# Patient Record
Sex: Female | Born: 2000 | Race: Black or African American | Hispanic: No | Marital: Single | Smoking: Never smoker
Health system: Southern US, Community
[De-identification: ages and names within clinical notes are randomized; demographics above are authoritative.]

## PROBLEM LIST (undated history)

## (undated) ENCOUNTER — Emergency Department (HOSPITAL_COMMUNITY): Admission: EM | Discharge status: Absent without leave | Payer: Medicaid Other

## (undated) DIAGNOSIS — F32A Depression, unspecified: Secondary | ICD-10-CM

## (undated) HISTORY — DX: Depression, unspecified: F32.A

---
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
## 2020-04-16 ENCOUNTER — Ambulatory Visit (HOSPITAL_COMMUNITY)
Admission: AD | Admit: 2020-04-16 | Discharge: 2020-04-16 | Disposition: A | Discharge status: Home / Self Care | Payer: Medicaid Other | Attending: Psychiatry | Admitting: Psychiatry

## 2020-04-16 ENCOUNTER — Encounter (HOSPITAL_COMMUNITY): Payer: Self-pay | Admitting: Emergency Medicine

## 2020-04-16 ENCOUNTER — Emergency Department (HOSPITAL_COMMUNITY)
Admission: EM | Admit: 2020-04-16 | Discharge: 2020-04-16 | Disposition: A | Discharge status: Home / Self Care | Payer: Medicaid Other | Attending: Emergency Medicine | Admitting: Emergency Medicine

## 2020-04-16 ENCOUNTER — Other Ambulatory Visit: Payer: Self-pay

## 2020-04-16 ENCOUNTER — Emergency Department (HOSPITAL_COMMUNITY): Payer: Medicaid Other

## 2020-04-16 DIAGNOSIS — Z79899 Other long term (current) drug therapy: Secondary | ICD-10-CM | POA: Diagnosis not present

## 2020-04-16 DIAGNOSIS — R0789 Other chest pain: Secondary | ICD-10-CM | POA: Diagnosis present

## 2020-04-16 DIAGNOSIS — R45851 Suicidal ideations: Secondary | ICD-10-CM | POA: Insufficient documentation

## 2020-04-16 DIAGNOSIS — R450 Nervousness: Secondary | ICD-10-CM | POA: Insufficient documentation

## 2020-04-16 DIAGNOSIS — F410 Panic disorder [episodic paroxysmal anxiety] without agoraphobia: Secondary | ICD-10-CM | POA: Insufficient documentation

## 2020-04-16 DIAGNOSIS — F419 Anxiety disorder, unspecified: Secondary | ICD-10-CM | POA: Diagnosis not present

## 2020-04-16 DIAGNOSIS — F332 Major depressive disorder, recurrent severe without psychotic features: Secondary | ICD-10-CM | POA: Diagnosis not present

## 2020-04-16 DIAGNOSIS — R634 Abnormal weight loss: Secondary | ICD-10-CM | POA: Insufficient documentation

## 2020-04-16 LAB — CBC
HCT: 41.4 % (ref 36.0–46.0)
Hemoglobin: 14.4 g/dL (ref 12.0–15.0)
MCH: 30.4 pg (ref 26.0–34.0)
MCHC: 34.8 g/dL (ref 30.0–36.0)
MCV: 87.3 fL (ref 80.0–100.0)
Platelets: 231 K/uL (ref 150–400)
RBC: 4.74 MIL/uL (ref 3.87–5.11)
RDW: 11.9 % (ref 11.5–15.5)
WBC: 5.5 K/uL (ref 4.0–10.5)
nRBC: 0 % (ref 0.0–0.2)

## 2020-04-16 LAB — COMPREHENSIVE METABOLIC PANEL
ALT: 20 U/L (ref 0–44)
AST: 19 U/L (ref 15–41)
Albumin: 5.2 g/dL — ABNORMAL HIGH (ref 3.5–5.0)
Alkaline Phosphatase: 54 U/L (ref 38–126)
Anion gap: 10 (ref 5–15)
BUN: 15 mg/dL (ref 6–20)
CO2: 23 mmol/L (ref 22–32)
Calcium: 9.4 mg/dL (ref 8.9–10.3)
Chloride: 103 mmol/L (ref 98–111)
Creatinine, Ser: 0.55 mg/dL (ref 0.44–1.00)
GFR calc Af Amer: >60 mL/min (ref >60)
GFR calc non Af Amer: >60 mL/min (ref >60)
Glucose, Bld: 86 mg/dL (ref 70–99)
Potassium: 3.7 mmol/L (ref 3.5–5.1)
Sodium: 136 mmol/L (ref 135–145)
Total Bilirubin: 1.5 mg/dL — ABNORMAL HIGH (ref 0.3–1.2)
Total Protein: 8.3 g/dL — ABNORMAL HIGH (ref 6.5–8.1)

## 2020-04-16 LAB — PREGNANCY, URINE: Preg Test, Ur: NEGATIVE

## 2020-04-16 LAB — TROPONIN I (HIGH SENSITIVITY)
Troponin I (High Sensitivity): <2 ng/L (ref <18)
Troponin I (High Sensitivity): <2 ng/L (ref <18)

## 2020-04-16 MED ORDER — ALUM & MAG HYDROXIDE-SIMETH 200-200-20 MG/5ML PO SUSP
30.0000 mL | Freq: Once | ORAL | Status: AC
  Administered 2020-04-16: 30 mL via ORAL
  Filled 2020-04-16: qty 30

## 2020-04-16 MED ORDER — OMEPRAZOLE 20 MG PO CPDR: 20.0000 mg | DELAYED_RELEASE_CAPSULE | Freq: Two times a day (BID) | ORAL | 0 refills | Status: AC

## 2020-04-16 MED ORDER — LIDOCAINE VISCOUS HCL 2 % MT SOLN
15.0000 mL | Freq: Once | OROMUCOSAL | Status: AC
  Administered 2020-04-16: 15 mL via ORAL
  Filled 2020-04-16: qty 15
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
## 2020-04-16 NOTE — H&P (Addendum)
Behavioral Health Medical Screening Exam  Catherine Valenzuela is an 19 y.o. female who reports to [facility] walk in accompanied by her mother for evaluation of worsening depressive symptoms.  The patient came at the request of her primary care provider, Kayren Eaves, NP.  Ms. Anselm Jungling also phoned me regarding the patient.  She saw the patient earlier today, per her assessment the patient was disheveled, depressed mood, endorsing panic attacks, suicidal ideations and approximately 25 pound weight loss over the past few months.  Since this was a marked change, the patient would benefit from further psychiatric evaluation.    On evaluation today, she appears sad, endorses depressed mood in the setting of several psychosocial familial stressors that includes the lost of 3 family members within the past ~7 months.  One of the deaths is suspected suicide. The patient also endorses neurovegetative symptoms.  She also reports sleeping less, eating less and worrying more.  She reports a long standing history for depression with failed attempts to medicate with SSRIs.  She has a remarkable familial history for depression, and bipolar disorder which includes a first degree relative.  She denies previous inpatient psychiatric hospitalization, reports 4 previous suicidal attempts that includes an overdose attempt a few days prior.  She states she took bills and drank wine and went to sleep.  When asked of her goal she states, " I just did not want to wake up".      Total Time spent with patient: 30 minutes  Psychiatric Specialty Exam: Physical Exam  Constitutional: She is oriented to person, place, and time.  Thin framed female  Eyes: Pupils are equal, round, and reactive to light.  Cardiovascular: Normal rate.  Respiratory: Effort normal.  Musculoskeletal:        General: Normal range of motion.     Cervical back: Normal range of motion.  Neurological: She is alert and oriented to person, place, and time.    Review of  Systems  Psychiatric/Behavioral: Positive for decreased concentration, self-injury, sleep disturbance and suicidal ideas (with recent intent to overdose on "sleep pills and wine"). Negative for agitation, behavioral problems and hallucinations. The patient is nervous/anxious.     There were no vitals taken for this visit.There is no height or weight on file to calculate BMI.  General Appearance: Fairly Groomed and wearing hair bonnet and black hoodie over her head  Eye Contact:  Good  Speech:  Normal Rate and partially slurred d/t baseline hearing deficits  Volume:  Normal  Mood:  Anxious and Depressed  Affect:  Congruent and Labile  Thought Process:  Coherent and Descriptions of Associations: Circumstantial  Orientation:  Full (Time, Place, and Person)  Thought Content:  Illogical and in the setting of recent attempt to overdose  Suicidal Thoughts:  Yes.  with intent/plan  Homicidal Thoughts:  No  Memory:  Immediate;   Good Recent;   Good Remote;   Good  Judgement:  Impaired  Insight:  Fair  Psychomotor Activity:  Increased  Concentration: Concentration: Fair and Attention Span: Fair  Recall:  Good  Fund of Knowledge:Good  Language: Good  Akathisia:  NA  Handed:  Right  AIMS (if indicated):     Assets:  Communication Skills Desire for Improvement Housing Social Support  Sleep:   < 6 hours    Musculoskeletal: Strength & Muscle Tone: within normal limits Gait & Station: normal Patient leans: N/A  There were no vitals taken for this visit.  Recommendations:  Based on my evaluation the patient  appears to have an emergency medical condition for which I recommend the patient be transferred to the emergency department for further evaluation.   The patient endorses left sided chest discomfort x 2 weeks but worsening over the past 48 hours. She reports a hx for panic attacks which likely explains her symptoms but I do not have baseline knowledge of this patient.  Thus, in the  interest of safety, recommend ED evaluation to r/o other concerns.     Once stabilized, recommend inpatient psychiatric care to start medications for mood stabilization.  The plan was discussed with the patient who voluntarily accepts admission.   Pending labs, she can be started on the following: Hydroxyzine 25mg  po TID anxiety Remeron 15 mg po qhs for mood stabilization  Report was called Erline Levine, Camera operator at [employer].   Mallie Darting, NP 04/16/2020, 6:45 PM

## 2020-04-16 NOTE — ED Provider Notes (Signed)
[place] DEPT Provider Note   CSN: [id number] Arrival date & time: 04/16/20  1903     History No chief complaint on file.   Catherine Valenzuela is a 19 y.o. female.  The history is provided by the patient and a relative. No language interpreter was used.     19 year old female with history of congenital deafness, hard of hearing, brought in accompanied by family members for evaluation of chest pain.  History obtained through patient and through sister who is at bedside.  For the past 3 days patient has had intermittent midsternal and left-sided chest pain.  Pain is sharp, waxing waning, brought on by eating, and currently rates pain as 5 out of 10.  She noticed more pain at nighttime.  No associated fever chills no productive cough no shortness of breath nausea vomiting diarrhea or rash.  She used marijuana on occasion.  She does have family history of cardiac disease.  She denies any specific treatment tried at home.  No recent Covid exposure.  History reviewed. No pertinent past medical history.  There are no problems to display for this patient.   History reviewed. No pertinent surgical history.   OB History   No obstetric history on file.     Family History  Problem Relation Age of Onset  . Hypertension Mother     Social History   Tobacco Use  . Smoking status: Never Smoker  . Smokeless tobacco: Never Used  Substance Use Topics  . Alcohol use: No  . Drug use: No    Home Medications Prior to Admission medications   Medication Sig Start Date End Date Taking? Authorizing Provider  cholecalciferol (VITAMIN D) 1000 units tablet Take 1,000 Units by mouth every 3 (three) days.    [provider]    Allergies    Patient has no known allergies.  Review of Systems   Review of Systems  All other systems reviewed and are negative.   Physical Exam Updated Vital Signs BP 112/87 (BP Location: Left Arm)   Pulse (!) 102   Temp  98.1 F (36.7 C) (Oral)   Resp 16   SpO2 100%   Physical Exam Vitals and nursing note reviewed.  Constitutional:      General: She is not in acute distress.    Appearance: She is well-developed.  HENT:     Head: Atraumatic.  Eyes:     Conjunctiva/sclera: Conjunctivae normal.  Cardiovascular:     Rate and Rhythm: Normal rate and regular rhythm.     Pulses: Normal pulses.     Heart sounds: Normal heart sounds.  Pulmonary:     Effort: Pulmonary effort is normal.     Breath sounds: Normal breath sounds.  Chest:     Chest wall: No tenderness.  Abdominal:     Palpations: Abdomen is soft.     Tenderness: There is no abdominal tenderness.  Musculoskeletal:     Cervical back: Neck supple.     Right lower leg: No edema.     Left lower leg: No edema.  Skin:    Findings: No rash.  Neurological:     Mental Status: She is alert.  Psychiatric:        Mood and Affect: Mood normal.     ED Results / Procedures / Treatments   Labs (all labs ordered are listed, but only abnormal results are displayed) Labs Reviewed  COMPREHENSIVE METABOLIC PANEL - Abnormal; Notable for the following components:  Result Value   Total Protein 8.3 (*)    Albumin 5.2 (*)    Total Bilirubin 1.5 (*)    All other components within normal limits  PREGNANCY, URINE  CBC  TROPONIN I (HIGH SENSITIVITY)  TROPONIN I (HIGH SENSITIVITY)    EKG EKG Interpretation  Date/Time:  Thursday Apr 16 2020 21:41:18 EDT Ventricular Rate:  78 PR Interval:    QRS Duration: 75 QT Interval:  364 QTC Calculation: 415 R Axis:   80 Text Interpretation: SR no acute ischemic appeaance. Confirmed by Arby Barrette [phone]) on 04/16/2020 10:51:10 PM    Date: 04/16/2020  Rate: 78  Rhythm: normal sinus rhythm  QRS Axis: normal  Intervals: normal  ST/T Wave abnormalities: normal  Conduction Disutrbances: none  Narrative Interpretation: monitor indicating afib, however pt has normal sinus rhythm.  Old EKG Reviewed: No  significant changes noted     Radiology DG Chest 1 View  Result Date: 04/16/2020 CLINICAL DATA:  Shortness of breath, chest pain EXAM: CHEST  1 VIEW COMPARISON:  None. FINDINGS: Single frontal view of the chest demonstrates an unremarkable cardiac silhouette. No airspace disease, effusion, or pneumothorax. Mild right convex curvature in the upper lumbar spine. No acute bony abnormalities. IMPRESSION: 1. No acute process. Electronically Signed   By: Sharlet Salina M.D.   On: 04/16/2020 19:55    Procedures Procedures (including critical care time)  Medications Ordered in ED Medications - No data to display  ED Course  I have reviewed the triage vital signs and the nursing notes.  Pertinent labs & imaging results that were available during my care of the patient were reviewed by me and considered in my medical decision making (see chart for details).    MDM Rules/Calculators/A&P                      BP 114/69   Pulse 89   Temp 98.1 F (36.7 C) (Oral)   Resp 16   SpO2 100%    Final Clinical Impression(s) / ED Diagnoses Final diagnoses:  Atypical chest pain    Rx / DC Orders ED Discharge Orders         Ordered    omeprazole (PRILOSEC) 20 MG capsule  2 times daily before meals     04/16/20 2252         8:56 PM Patient here with planes of chest pain.  Pain is atypical of ACS.  Pain is more likely to be gastritis/GERD.  Heart score of 1, low risk of Mace.  We will give GI cocktail, work-up initiated.  No shortness of breath therefore low suspicion for PE.  10:46 PM Negative delta troponin.  Labs are reassuring.  EKG unremarkable.  Patient received GI cocktail with resolve her symptoms.  Will discharge home with PPI for suspected gastritis/GERD.  Return precautions discussed.   Fayrene Helper, PA-C 04/16/20 2252    Arby Barrette, MD 04/16/20 2302

## 2020-06-26 ENCOUNTER — Emergency Department (HOSPITAL_BASED_OUTPATIENT_CLINIC_OR_DEPARTMENT_OTHER)
Admission: EM | Admit: 2020-06-26 | Discharge: 2020-06-26 | Disposition: A | Discharge status: Home / Self Care | Payer: Medicaid Other | Attending: Emergency Medicine | Admitting: Emergency Medicine

## 2020-06-26 ENCOUNTER — Other Ambulatory Visit: Payer: Self-pay

## 2020-06-26 ENCOUNTER — Encounter (HOSPITAL_BASED_OUTPATIENT_CLINIC_OR_DEPARTMENT_OTHER): Payer: Self-pay

## 2020-06-26 DIAGNOSIS — R350 Frequency of micturition: Secondary | ICD-10-CM | POA: Diagnosis not present

## 2020-06-26 DIAGNOSIS — R1032 Left lower quadrant pain: Secondary | ICD-10-CM | POA: Diagnosis present

## 2020-06-26 LAB — URINALYSIS, ROUTINE W REFLEX MICROSCOPIC
Bilirubin Urine: NEGATIVE
Glucose, UA: NEGATIVE mg/dL
Ketones, ur: NEGATIVE mg/dL
Leukocytes,Ua: NEGATIVE
Nitrite: NEGATIVE
Protein, ur: NEGATIVE mg/dL
Specific Gravity, Urine: 1.02 (ref 1.005–1.030)
pH: 6.5 (ref 5.0–8.0)

## 2020-06-26 LAB — COMPREHENSIVE METABOLIC PANEL
ALT: 22 U/L (ref 0–44)
AST: 23 U/L (ref 15–41)
Albumin: 5.2 g/dL — ABNORMAL HIGH (ref 3.5–5.0)
Alkaline Phosphatase: 52 U/L (ref 38–126)
Anion gap: 11 (ref 5–15)
BUN: 9 mg/dL (ref 6–20)
CO2: 25 mmol/L (ref 22–32)
Calcium: 9.7 mg/dL (ref 8.9–10.3)
Chloride: 101 mmol/L (ref 98–111)
Creatinine, Ser: 0.58 mg/dL (ref 0.44–1.00)
GFR calc Af Amer: >60 mL/min (ref >60)
GFR calc non Af Amer: >60 mL/min (ref >60)
Glucose, Bld: 86 mg/dL (ref 70–99)
Potassium: 4.1 mmol/L (ref 3.5–5.1)
Sodium: 137 mmol/L (ref 135–145)
Total Bilirubin: 0.5 mg/dL (ref 0.3–1.2)
Total Protein: 8.7 g/dL — ABNORMAL HIGH (ref 6.5–8.1)

## 2020-06-26 LAB — URINALYSIS, MICROSCOPIC (REFLEX)

## 2020-06-26 LAB — CBC WITH DIFFERENTIAL/PLATELET
Abs Immature Granulocytes: 0.01 10*3/uL (ref 0.00–0.07)
Basophils Absolute: 0 10*3/uL (ref 0.0–0.1)
Basophils Relative: 1 %
Eosinophils Absolute: 0 10*3/uL (ref 0.0–0.5)
Eosinophils Relative: 1 %
HCT: 39.2 % (ref 36.0–46.0)
Hemoglobin: 13.5 g/dL (ref 12.0–15.0)
Immature Granulocytes: 0 %
Lymphocytes Relative: 36 %
Lymphs Abs: 1.8 10*3/uL (ref 0.7–4.0)
MCH: 30.1 pg (ref 26.0–34.0)
MCHC: 34.4 g/dL (ref 30.0–36.0)
MCV: 87.3 fL (ref 80.0–100.0)
Monocytes Absolute: 0.3 10*3/uL (ref 0.1–1.0)
Monocytes Relative: 7 %
Neutro Abs: 2.8 10*3/uL (ref 1.7–7.7)
Neutrophils Relative %: 55 %
Platelets: 181 10*3/uL (ref 150–400)
RBC: 4.49 MIL/uL (ref 3.87–5.11)
RDW: 11.4 % — ABNORMAL LOW (ref 11.5–15.5)
WBC: 4.9 10*3/uL (ref 4.0–10.5)
nRBC: 0 % (ref 0.0–0.2)

## 2020-06-26 LAB — HCG, QUANTITATIVE, PREGNANCY: hCG, Beta Chain, Quant, S: <1 m[IU]/mL (ref <5)

## 2020-06-26 LAB — WET PREP, GENITAL
Clue Cells Wet Prep HPF POC: NONE SEEN
Sperm: NONE SEEN
Trich, Wet Prep: NONE SEEN
Yeast Wet Prep HPF POC: NONE SEEN

## 2020-06-26 LAB — LIPASE, BLOOD: Lipase: 24 U/L (ref 11–51)

## 2020-06-26 LAB — PREGNANCY, URINE: Preg Test, Ur: NEGATIVE

## 2020-06-26 NOTE — ED Triage Notes (Signed)
Abd pain x 1 week, at home preg test positive ~2 days ago, decreased appetite  n no v/d, NAD

## 2020-06-26 NOTE — ED Provider Notes (Signed)
[facility] EMERGENCY DEPARTMENT Provider Note   CSN: [id number] Arrival date & time: 06/26/20  1825     History Chief Complaint  Patient presents with  . Abdominal Pain    Catherine Valenzuela is a 19 y.o. female.  19 year old female with complaint of left lower quadrant abdominal pain x1 week.  Pain is intermittent, described as an ache, nothing makes pain better or worse.  Denies any associated nausea, vomiting, changes in bowel or bladder habits or vaginal discharge.  Patient is sexually active, not on birth control, not using condoms.  Patient states that she stopped birth control 1 year ago and has not had a period since then.  Patient had a pregnancy test at home which was very faintly positive and came to the ER today at the prompting of her mother.  No other complaints or concerns today.        History reviewed. No pertinent past medical history.  There are no problems to display for this patient.   History reviewed. No pertinent surgical history.   OB History   No obstetric history on file.     Family History  Problem Relation Age of Onset  . Hypertension Mother     Social History   Tobacco Use  . Smoking status: Never Smoker  . Smokeless tobacco: Never Used  Substance Use Topics  . Alcohol use: No  . Drug use: No    Home Medications Prior to Admission medications   Medication Sig Start Date End Date Taking? Authorizing Provider  cholecalciferol (VITAMIN D) 1000 units tablet Take 1,000 Units by mouth every 3 (three) days.    [provider]  omeprazole (PRILOSEC) 20 MG capsule Take 1 capsule (20 mg total) by mouth 2 (two) times daily before a meal. 04/16/20   Fayrene Helper, PA-C    Allergies    Patient has no known allergies.  Review of Systems   Review of Systems  Constitutional: Negative for chills and fever.  Respiratory: Negative for shortness of breath.   Cardiovascular: Negative for chest pain.  Gastrointestinal: Positive for  abdominal pain. Negative for nausea and vomiting.  Genitourinary: Positive for frequency. Negative for dysuria, vaginal bleeding and vaginal discharge.  Musculoskeletal: Negative for back pain.  Skin: Negative for rash and wound.  Neurological: Negative for weakness.  Hematological: Negative for adenopathy.  All other systems reviewed and are negative.   Physical Exam Updated Vital Signs BP (!) 110/57   Pulse 74   Temp 99 F (37.2 C) (Oral)   Resp 17   Ht 5\' 6"  (1.676 m)   Wt 54.4 kg   SpO2 100%   BMI 19.37 kg/m   Physical Exam Vitals and nursing note reviewed. Exam conducted with a chaperone present.  Constitutional:      General: She is not in acute distress.    Appearance: She is well-developed. She is not diaphoretic.  HENT:     Head: Normocephalic and atraumatic.  Cardiovascular:     Rate and Rhythm: Normal rate and regular rhythm.     Heart sounds: Normal heart sounds.  Pulmonary:     Effort: Pulmonary effort is normal.     Breath sounds: Normal breath sounds.  Abdominal:     Palpations: Abdomen is soft.     Tenderness: There is abdominal tenderness in the left lower quadrant. There is no right CVA tenderness or left CVA tenderness.  Genitourinary:    Vagina: No tenderness.     Cervix: No cervical motion  tenderness, friability, lesion, erythema or cervical bleeding.     Uterus: Normal. Not tender.      Adnexa: Right adnexa normal and left adnexa normal.       Right: No mass, tenderness or fullness.         Left: No mass, tenderness or fullness.       Comments: Trace amount of white discharge Skin:    General: Skin is warm and dry.     Findings: No erythema or rash.  Neurological:     Mental Status: She is alert and oriented to person, place, and time.  Psychiatric:        Behavior: Behavior normal.     ED Results / Procedures / Treatments   Labs (all labs ordered are listed, but only abnormal results are displayed) Labs Reviewed  WET PREP, GENITAL -  Abnormal; Notable for the following components:      Result Value   WBC, Wet Prep HPF POC MANY (*)    All other components within normal limits  COMPREHENSIVE METABOLIC PANEL - Abnormal; Notable for the following components:   Total Protein 8.7 (*)    Albumin 5.2 (*)    All other components within normal limits  URINALYSIS, ROUTINE W REFLEX MICROSCOPIC - Abnormal; Notable for the following components:   Hgb urine dipstick TRACE (*)    All other components within normal limits  URINALYSIS, MICROSCOPIC (REFLEX) - Abnormal; Notable for the following components:   Bacteria, UA RARE (*)    All other components within normal limits  CBC WITH DIFFERENTIAL/PLATELET - Abnormal; Notable for the following components:   RDW 11.4 (*)    All other components within normal limits  LIPASE, BLOOD  PREGNANCY, URINE  HCG, QUANTITATIVE, PREGNANCY  CBC WITH DIFFERENTIAL/PLATELET  RPR  HIV ANTIBODY (ROUTINE TESTING W REFLEX)  GC/CHLAMYDIA PROBE AMP ([place]) NOT AT [facility]    EKG None  Radiology No results found.  Procedures Procedures (including critical care time)  Medications Ordered in ED Medications - No data to display  ED Course  I have reviewed the triage vital signs and the nursing notes.  Pertinent labs & imaging results that were available during my care of the patient were reviewed by me and considered in my medical decision making (see chart for details).  Clinical Course as of Jun 26 2310  Fri Jun 26, 2020  385 19 year old female with complaint of left lower quadrant abdominal pain for the past week.  Patient reports faint positive urine pregnancy test at home.  On exam, has mild tenderness in left lower quadrant, no significant pelvic tenderness.  Trace amount of white vaginal discharge otherwise unremarkable.  Labs reassuring including no significant findings on CMP, urinalysis with trace hemoglobin without evidence of UTI.  Wet prep negative for yeast, trichomoniasis, clue  cells.  Urine pregnancy test is negative.  GC chlamydia, RPR, HIV send out testing.  Pending hCG quant and CBC before disposition.    [LM]  2253 CBC unremarkable.    [LM]    Clinical Course User Index [LM] Alden Hipp   MDM Rules/Calculators/A&P                          Final Clinical Impression(s) / ED Diagnoses Final diagnoses:  Left lower quadrant abdominal pain    Rx / DC Orders ED Discharge Orders    None       Jeannie Fend, PA-C 06/26/20 2311  Virgina Norfolk, DO 06/28/20 1621

## 2020-06-26 NOTE — Discharge Instructions (Signed)
Your pregnancy test today is negative.  Follow-up with your doctor if your pain continues. Tests sent out today include gonorrhea and chlamydia, syphilis and HIV.  These results will be available in your MyChart account.

## 2020-06-27 LAB — HIV ANTIBODY (ROUTINE TESTING W REFLEX): HIV Screen 4th Generation wRfx: NONREACTIVE

## 2020-06-27 LAB — RPR: RPR Ser Ql: NONREACTIVE

## 2020-06-29 LAB — GC/CHLAMYDIA PROBE AMP (~~LOC~~) NOT AT ARMC
Chlamydia: NEGATIVE
Comment: NEGATIVE
Comment: NORMAL
Neisseria Gonorrhea: POSITIVE — AB

## 2021-08-08 IMAGING — CR DG CHEST 1V
1 series · 1 of 1 positions shown · non-contrast
Comparison: None.

CLINICAL DATA: Shortness of breath, chest pain

EXAM:
CHEST  1 VIEW

[w chest pa]
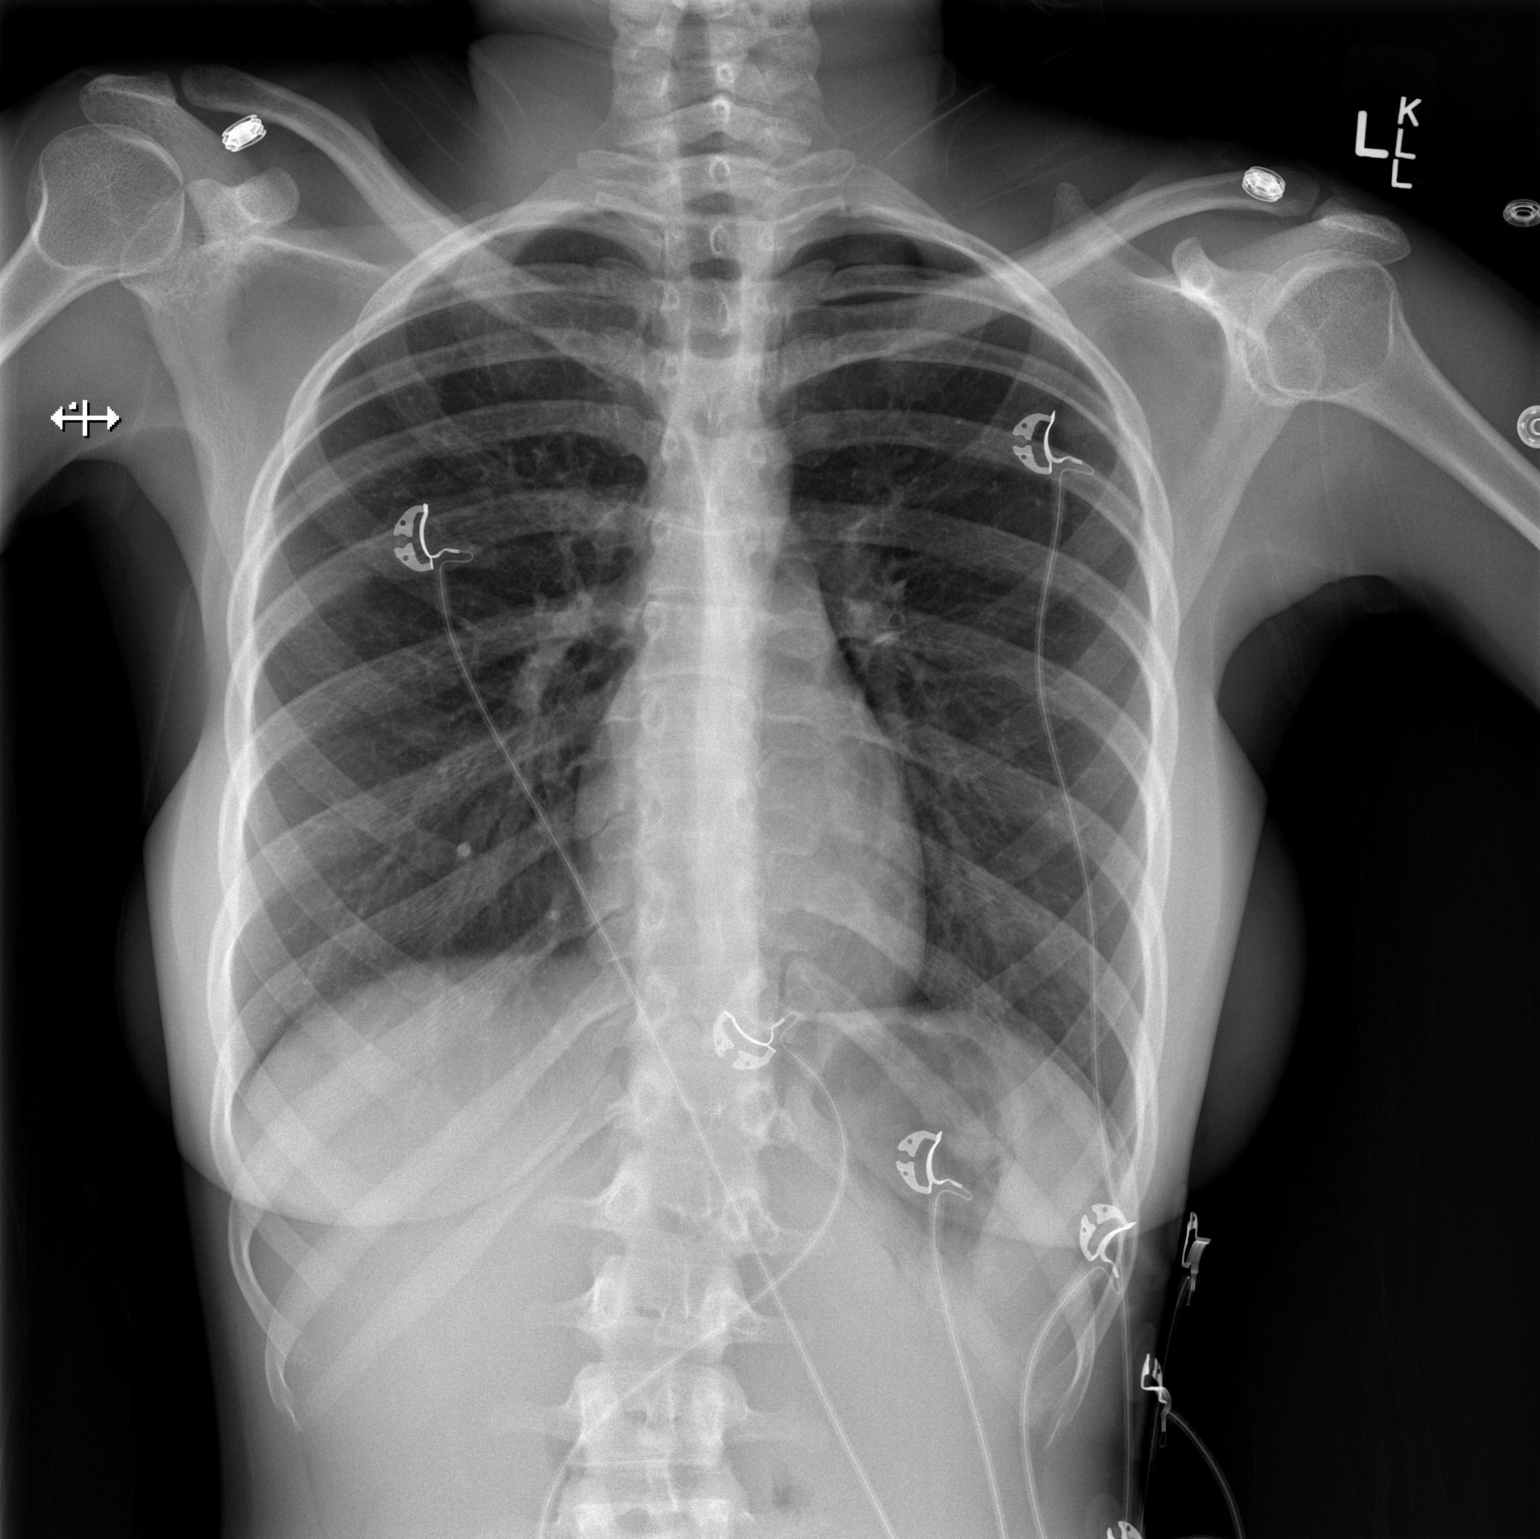

[1 of 1 positions shown; findings below may reference images not displayed]

FINDINGS: Single frontal view of the chest demonstrates an unremarkable
cardiac silhouette. No airspace disease, effusion, or pneumothorax.
Mild right convex curvature in the upper lumbar spine. No acute bony
abnormalities.
IMPRESSION: 1. No acute process.

## 2022-03-25 ENCOUNTER — Emergency Department (HOSPITAL_COMMUNITY)
Admission: EM | Admit: 2022-03-25 | Discharge: 2022-03-25 | Disposition: A | Discharge status: Home / Self Care | Payer: Medicaid Other | Attending: Emergency Medicine | Admitting: Emergency Medicine

## 2022-03-25 ENCOUNTER — Encounter (HOSPITAL_COMMUNITY): Payer: Self-pay | Admitting: Emergency Medicine

## 2022-03-25 DIAGNOSIS — N938 Other specified abnormal uterine and vaginal bleeding: Secondary | ICD-10-CM

## 2022-03-25 DIAGNOSIS — O26891 Other specified pregnancy related conditions, first trimester: Secondary | ICD-10-CM | POA: Diagnosis present

## 2022-03-25 DIAGNOSIS — O23591 Infection of other part of genital tract in pregnancy, first trimester: Secondary | ICD-10-CM | POA: Insufficient documentation

## 2022-03-25 DIAGNOSIS — B9689 Other specified bacterial agents as the cause of diseases classified elsewhere: Secondary | ICD-10-CM

## 2022-03-25 LAB — CBC WITH DIFFERENTIAL/PLATELET
Abs Immature Granulocytes: 0.01 10*3/uL (ref 0.00–0.07)
Basophils Absolute: 0 10*3/uL (ref 0.0–0.1)
Basophils Relative: 1 %
Eosinophils Absolute: 0.1 10*3/uL (ref 0.0–0.5)
Eosinophils Relative: 1 %
HCT: 37.9 % (ref 36.0–46.0)
Hemoglobin: 13 g/dL (ref 12.0–15.0)
Immature Granulocytes: 0 %
Lymphocytes Relative: 34 %
Lymphs Abs: 1.5 10*3/uL (ref 0.7–4.0)
MCH: 28.6 pg (ref 26.0–34.0)
MCHC: 34.3 g/dL (ref 30.0–36.0)
MCV: 83.3 fL (ref 80.0–100.0)
Monocytes Absolute: 0.4 10*3/uL (ref 0.1–1.0)
Monocytes Relative: 10 %
Neutro Abs: 2.3 10*3/uL (ref 1.7–7.7)
Neutrophils Relative %: 54 %
Platelets: 225 10*3/uL (ref 150–400)
RBC: 4.55 MIL/uL (ref 3.87–5.11)
RDW: 14.2 % (ref 11.5–15.5)
WBC: 4.3 10*3/uL (ref 4.0–10.5)
nRBC: 0 % (ref 0.0–0.2)

## 2022-03-25 LAB — COMPREHENSIVE METABOLIC PANEL
ALT: 13 U/L (ref 0–44)
AST: 19 U/L (ref 15–41)
Albumin: 4.3 g/dL (ref 3.5–5.0)
Alkaline Phosphatase: 53 U/L (ref 38–126)
Anion gap: 6 (ref 5–15)
BUN: 13 mg/dL (ref 6–20)
CO2: 26 mmol/L (ref 22–32)
Calcium: 9.6 mg/dL (ref 8.9–10.3)
Chloride: 107 mmol/L (ref 98–111)
Creatinine, Ser: 0.67 mg/dL (ref 0.44–1.00)
GFR, Estimated: >60 mL/min (ref >60)
Glucose, Bld: 131 mg/dL — ABNORMAL HIGH (ref 70–99)
Potassium: 3.7 mmol/L (ref 3.5–5.1)
Sodium: 139 mmol/L (ref 135–145)
Total Bilirubin: 0.5 mg/dL (ref 0.3–1.2)
Total Protein: 7.7 g/dL (ref 6.5–8.1)

## 2022-03-25 LAB — WET PREP, GENITAL
Sperm: NONE SEEN
Trich, Wet Prep: NONE SEEN
WBC, Wet Prep HPF POC: <10 (ref <10)
Yeast Wet Prep HPF POC: NONE SEEN

## 2022-03-25 LAB — HCG, QUANTITATIVE, PREGNANCY: hCG, Beta Chain, Quant, S: <1 m[IU]/mL (ref <5)

## 2022-03-25 LAB — TYPE AND SCREEN
ABO/RH(D): A POS
Antibody Screen: NEGATIVE

## 2022-03-25 MED ORDER — METRONIDAZOLE 500 MG PO TABS: 500.0000 mg | ORAL_TABLET | Freq: Two times a day (BID) | ORAL | 0 refills | Status: AC

## 2022-03-25 MED ORDER — SODIUM CHLORIDE 0.9 % IV BOLUS
1000.0000 mL | Freq: Once | INTRAVENOUS | Status: AC
  Administered 2022-03-25: 1000 mL via INTRAVENOUS

## 2022-03-25 MED ORDER — IBUPROFEN 200 MG PO TABS
600.0000 mg | ORAL_TABLET | Freq: Once | ORAL | Status: DC
Start: 2022-03-25 — End: 2022-03-26

## 2022-03-25 NOTE — ED Triage Notes (Signed)
Pt arriving POV with mother with concerns of possible pregnancy and vaginal bleeding. Pt provided pictures to staff, bleeding is light spotting at this time and pt reports it began approx 2 days ago. Pt also reports lower abdominal cramping sensation. No urinary symptoms.  ?

## 2022-03-25 NOTE — ED Provider Triage Note (Signed)
Emergency Medicine Provider Triage Evaluation Note ? ?Catherine Valenzuela , a 21 y.o. G2 T0 P0 A1 L0 female  was evaluated in triage.  Pt complains of spotting.  States her last menstrual cycle was around March 8.  She had a positive home pregnancy test last week.  She has been having intermittent spotting for the past 2 days.  She has blood when wiping.  Also notes some lower abdominal cramping and low back pain.  States her first pregnancy ended in miscarriage around 4 months. ? ?Physical Exam  ?BP 133/85 (BP Location: Right Arm)   Pulse (!) 107   Temp 98 ?F (36.7 ?C) (Oral)   Resp 18   SpO2 100%  ?Gen:   Awake, no distress   ?Resp:  Normal effort  ?MSK:   Moves extremities without difficulty  ?Other:   ? ?Medical Decision Making  ?Medically screening exam initiated at 4:29 PM.  Appropriate orders placed.  Sreshta Cressler was informed that the remainder of the evaluation will be completed by another provider, this initial triage assessment does not replace that evaluation, and the importance of remaining in the ED until their evaluation is complete. ?  ?Placido Sou, PA-C ?03/25/22 1631 ? ?

## 2022-03-25 NOTE — ED Provider Notes (Signed)
?[facility]-EMERGENCY DEPT ?Provider Note ? ? ?CSN: [id number] ?Arrival date & time: 03/25/22  1613 ? ?  ? ?History ? ?Chief Complaint  ?Patient presents with  ? Possible Pregnancy  ? ? ?Catherine Valenzuela is a 21 y.o. female. ? ?Pt is a 21 yo female G2 T0 P0 A1 L0 with pmhx significant for depression.  Pt said her LMP was on 3/8.  She took a home pregnancy test a few days ago which was positive.  She started having some spotting for the past 2 days.  She is also having some LLQ abd pain.   ? ? ?  ? ?Home Medications ?Prior to Admission medications   ?Medication Sig Start Date End Date Taking? Authorizing Provider  ?metroNIDAZOLE (FLAGYL) 500 MG tablet Take 1 tablet (500 mg total) by mouth 2 (two) times daily. 03/25/22  Yes Jacalyn LefevreHaviland, Andy Allende, MD  ?cholecalciferol (VITAMIN D) 1000 units tablet Take 1,000 Units by mouth every 3 (three) days.    [provider]  ?omeprazole (PRILOSEC) 20 MG capsule Take 1 capsule (20 mg total) by mouth 2 (two) times daily before a meal. 04/16/20   Fayrene Helperran, [place], PA-C  ?   ? ?Allergies    ?Patient has no known allergies.   ? ?Review of Systems   ?Review of Systems  ?Gastrointestinal:  Positive for abdominal pain.  ?Genitourinary:  Positive for vaginal bleeding.  ?All other systems reviewed and are negative. ? ?Physical Exam ?Updated Vital Signs ?BP 125/79   Pulse 70   Temp 98.8 ?F (37.1 ?C) (Oral)   Resp 18   SpO2 100%  ?Physical Exam ?Vitals and nursing note reviewed. Exam conducted with a chaperone present.  ?Constitutional:   ?   Appearance: Normal appearance.  ?HENT:  ?   Head: Normocephalic and atraumatic.  ?   Right Ear: External ear normal.  ?   Left Ear: External ear normal.  ?   Nose: Nose normal.  ?   Mouth/Throat:  ?   Mouth: Mucous membranes are moist.  ?   Pharynx: Oropharynx is clear.  ?Eyes:  ?   Extraocular Movements: Extraocular movements intact.  ?   Conjunctiva/sclera: Conjunctivae normal.  ?   Pupils: Pupils are equal, round, and reactive to  light.  ?Cardiovascular:  ?   Rate and Rhythm: Normal rate and regular rhythm.  ?   Pulses: Normal pulses.  ?   Heart sounds: Normal heart sounds.  ?Pulmonary:  ?   Effort: Pulmonary effort is normal.  ?   Breath sounds: Normal breath sounds.  ?Abdominal:  ?   General: Abdomen is flat. Bowel sounds are normal.  ?   Palpations: Abdomen is soft.  ?   Tenderness: There is abdominal tenderness in the left lower quadrant.  ?Genitourinary: ?   Exam position: Lithotomy position.  ?   Vagina: Bleeding present.  ?   Cervix: Cervical bleeding present.  ?   Adnexa:     ?   Left: Tenderness present.   ?Musculoskeletal:     ?   General: Normal range of motion.  ?   Cervical back: Normal range of motion and neck supple.  ?Skin: ?   General: Skin is warm.  ?   Capillary Refill: Capillary refill takes less than 2 seconds.  ?Neurological:  ?   General: No focal deficit present.  ?   Mental Status: She is alert and oriented to person, place, and time.  ?Psychiatric:     ?   Mood  and Affect: Mood normal.     ?   Behavior: Behavior normal.     ?   Thought Content: Thought content normal.     ?   Judgment: Judgment normal.  ? ? ?ED Results / Procedures / Treatments   ?Labs ?(all labs ordered are listed, but only abnormal results are displayed) ?Labs Reviewed  ?WET PREP, GENITAL - Abnormal; Notable for the following components:  ?    Result Value  ? Clue Cells Wet Prep HPF POC PRESENT (*)   ? All other components within normal limits  ?COMPREHENSIVE METABOLIC PANEL - Abnormal; Notable for the following components:  ? Glucose, Bld 131 (*)   ? All other components within normal limits  ?CBC WITH DIFFERENTIAL/PLATELET  ?HCG, QUANTITATIVE, PREGNANCY  ?TYPE AND SCREEN  ?GC/CHLAMYDIA PROBE AMP ([place]) NOT AT [facility]  ? ? ?EKG ?None ? ?Radiology ?No results found. ? ?Procedures ?Procedures  ? ? ?Medications Ordered in ED ?Medications  ?ibuprofen (ADVIL) tablet 600 mg (has no administration in time range)  ?sodium chloride 0.9 % bolus 1,000  mL (1,000 mLs Intravenous New Bag/Given 03/25/22 1729)  ? ? ?ED Course/ Medical Decision Making/ A&P ?  ?                        ?Medical Decision Making ?Amount and/or Complexity of Data Reviewed ?Labs: ordered. ? ?Risk ?OTC drugs. ? ? ?This patient presents to the ED for concern of abd pain and + pregnancy test, this involves an extensive number of treatment options, and is a complaint that carries with it a high risk of complications and morbidity.  The differential diagnosis includes miscarriage, ectopic preg, threatened ab ? ? ?Co morbidities that complicate the patient evaluation ? ?depression ? ? ?Additional history obtained: ? ?Additional history obtained from epic chart review ?External records from outside source obtained and reviewed including mom ? ? ?Lab Tests: ? ?I Ordered, and personally interpreted labs.  The pertinent results include:  cbc is nl, cmp nl; preg test is neg ? ?Cardiac Monitoring: ? ?The patient was maintained on a cardiac monitor.  I personally viewed and interpreted the cardiac monitored which showed an underlying rhythm of: nsr ? ? ?Medicines ordered and prescription drug management: ? ?I ordered medication including ibuprofen  for pain (after preg test neg)  ?Reevaluation of the patient after these medicines showed that the patient improved ?I have reviewed the patients home medicines and have made adjustments as needed ? ? ?Test Considered: ? ?[place]; however preg is neg. ? ? ?Problem List / ED Course: ? ?DUB:  preg is neg.  Pt is to f/u with obgyn. ?BV:  wet prep + for clue cells.  Pt will be treated with flagyl. ? ? ?Reevaluation: ? ?After the interventions noted above, I reevaluated the patient and found that they have :improved ? ? ?Social Determinants of Health: ? ?Lives at home ? ? ?Dispostion: ? ?After consideration of the diagnostic results and the patients response to treatment, I feel that the patent would benefit from discharge with outpatient f/u.   ? ? ? ? ? ? ? ?Final  Clinical Impression(s) / ED Diagnoses ?Final diagnoses:  ?DUB (dysfunctional uterine bleeding)  ?Bacterial vaginosis  ? ? ?Rx / DC Orders ?ED Discharge Orders   ? ?      Ordered  ?  metroNIDAZOLE (FLAGYL) 500 MG tablet  2 times daily       ? 03/25/22 1858  ? ?  ?  ? ?  ? ? ?  ?  Isla Pence, MD ?03/25/22 1859 ? ?

## 2022-03-28 LAB — GC/CHLAMYDIA PROBE AMP (~~LOC~~) NOT AT ARMC
Chlamydia: NEGATIVE
Comment: NEGATIVE
Comment: NORMAL
Neisseria Gonorrhea: NEGATIVE

## 2022-09-04 ENCOUNTER — Encounter (HOSPITAL_COMMUNITY): Payer: Self-pay

## 2022-09-04 ENCOUNTER — Other Ambulatory Visit: Payer: Self-pay

## 2022-09-04 ENCOUNTER — Emergency Department (HOSPITAL_COMMUNITY)
Admission: EM | Admit: 2022-09-04 | Discharge: 2022-09-04 | Disposition: A | Discharge status: Home / Self Care | Payer: Medicaid Other | Attending: Emergency Medicine | Admitting: Emergency Medicine

## 2022-09-04 DIAGNOSIS — Z20822 Contact with and (suspected) exposure to covid-19: Secondary | ICD-10-CM | POA: Insufficient documentation

## 2022-09-04 DIAGNOSIS — F10920 Alcohol use, unspecified with intoxication, uncomplicated: Secondary | ICD-10-CM | POA: Diagnosis present

## 2022-09-04 DIAGNOSIS — R45851 Suicidal ideations: Secondary | ICD-10-CM | POA: Insufficient documentation

## 2022-09-04 DIAGNOSIS — Y908 Blood alcohol level of 240 mg/100 ml or more: Secondary | ICD-10-CM | POA: Insufficient documentation

## 2022-09-04 DIAGNOSIS — N9489 Other specified conditions associated with female genital organs and menstrual cycle: Secondary | ICD-10-CM | POA: Insufficient documentation

## 2022-09-04 LAB — CBC WITH DIFFERENTIAL/PLATELET
Abs Immature Granulocytes: 0.01 10*3/uL (ref 0.00–0.07)
Basophils Absolute: 0.1 10*3/uL (ref 0.0–0.1)
Basophils Relative: 1 %
Eosinophils Absolute: 0.1 10*3/uL (ref 0.0–0.5)
Eosinophils Relative: 1 %
HCT: 38.7 % (ref 36.0–46.0)
Hemoglobin: 13 g/dL (ref 12.0–15.0)
Immature Granulocytes: 0 %
Lymphocytes Relative: 36 %
Lymphs Abs: 2.3 10*3/uL (ref 0.7–4.0)
MCH: 27.4 pg (ref 26.0–34.0)
MCHC: 33.6 g/dL (ref 30.0–36.0)
MCV: 81.5 fL (ref 80.0–100.0)
Monocytes Absolute: 0.4 10*3/uL (ref 0.1–1.0)
Monocytes Relative: 7 %
Neutro Abs: 3.5 10*3/uL (ref 1.7–7.7)
Neutrophils Relative %: 55 %
Platelets: 275 10*3/uL (ref 150–400)
RBC: 4.75 MIL/uL (ref 3.87–5.11)
RDW: 13.9 % (ref 11.5–15.5)
WBC: 6.3 10*3/uL (ref 4.0–10.5)
nRBC: 0 % (ref 0.0–0.2)

## 2022-09-04 LAB — COMPREHENSIVE METABOLIC PANEL
ALT: 17 U/L (ref 0–44)
AST: 27 U/L (ref 15–41)
Albumin: 5 g/dL (ref 3.5–5.0)
Alkaline Phosphatase: 64 U/L (ref 38–126)
Anion gap: 14 (ref 5–15)
BUN: 11 mg/dL (ref 6–20)
CO2: 22 mmol/L (ref 22–32)
Calcium: 10.1 mg/dL (ref 8.9–10.3)
Chloride: 108 mmol/L (ref 98–111)
Creatinine, Ser: 0.65 mg/dL (ref 0.44–1.00)
GFR, Estimated: >60 mL/min (ref >60)
Glucose, Bld: 110 mg/dL — ABNORMAL HIGH (ref 70–99)
Potassium: 3 mmol/L — ABNORMAL LOW (ref 3.5–5.1)
Sodium: 144 mmol/L (ref 135–145)
Total Bilirubin: 0.5 mg/dL (ref 0.3–1.2)
Total Protein: 8.9 g/dL — ABNORMAL HIGH (ref 6.5–8.1)

## 2022-09-04 LAB — RESP PANEL BY RT-PCR (FLU A&B, COVID) ARPGX2
Influenza A by PCR: NEGATIVE
Influenza B by PCR: NEGATIVE
SARS Coronavirus 2 by RT PCR: NEGATIVE

## 2022-09-04 LAB — RAPID URINE DRUG SCREEN, HOSP PERFORMED
Amphetamines: NOT DETECTED
Barbiturates: NOT DETECTED
Benzodiazepines: NOT DETECTED
Cocaine: NOT DETECTED
Opiates: NOT DETECTED
Tetrahydrocannabinol: POSITIVE — AB

## 2022-09-04 LAB — ETHANOL: Alcohol, Ethyl (B): 262 mg/dL — ABNORMAL HIGH (ref <10)

## 2022-09-04 LAB — I-STAT BETA HCG BLOOD, ED (MC, WL, AP ONLY): I-stat hCG, quantitative: <5 m[IU]/mL (ref <5)

## 2022-09-04 MED ORDER — SODIUM CHLORIDE 0.9 % IV BOLUS
1000.0000 mL | Freq: Once | INTRAVENOUS | Status: AC
  Administered 2022-09-04: 1000 mL via INTRAVENOUS

## 2022-09-04 MED ORDER — DIPHENHYDRAMINE HCL 50 MG/ML IJ SOLN
12.5000 mg | Freq: Once | INTRAMUSCULAR | Status: AC
  Administered 2022-09-04: 12.5 mg via INTRAVENOUS
  Filled 2022-09-04: qty 1

## 2022-09-04 NOTE — ED Notes (Signed)
Pt very sleepy, but answering questions. Pt states she does not want to live anymore, but does not have a plan to end her life. Pt crying and sobbing while responding. At this time, the pt does not want to see her mother.
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
## 2022-09-04 NOTE — ED Provider Notes (Signed)
  Physical Exam  BP 115/68   Pulse 86   Temp 98 F (36.7 C) (Oral)   Resp 20   Ht 5\' 6"  (1.676 m)   Wt 53 kg   SpO2 97%   BMI 18.86 kg/m   Physical Exam  Procedures  Procedures  ED Course / MDM    Medical Decision Making Amount and/or Complexity of Data Reviewed Labs: ordered.  Risk Prescription drug management.   Received patient in signout.  Had made suicidal statements while intoxicated.  Now more sober.  Denies any suicidal thoughts.  More clinically sober and will discharge home.       Davonna Belling, MD 09/04/22 1110

## 2024-08-30 ENCOUNTER — Ambulatory Visit (HOSPITAL_COMMUNITY): Payer: Self-pay

## 2024-11-24 ENCOUNTER — Emergency Department (HOSPITAL_COMMUNITY)
Admission: EM | Admit: 2024-11-24 | Discharge: 2024-11-25 | Disposition: A | Discharge status: Home / Self Care | Payer: MEDICAID | Attending: Emergency Medicine | Admitting: Emergency Medicine

## 2024-11-24 ENCOUNTER — Emergency Department (HOSPITAL_COMMUNITY): Payer: MEDICAID

## 2024-11-24 ENCOUNTER — Other Ambulatory Visit: Payer: Self-pay

## 2024-11-24 DIAGNOSIS — S62657A Nondisplaced fracture of medial phalanx of left little finger, initial encounter for closed fracture: Secondary | ICD-10-CM | POA: Insufficient documentation

## 2024-11-24 DIAGNOSIS — S6992XA Unspecified injury of left wrist, hand and finger(s), initial encounter: Secondary | ICD-10-CM | POA: Diagnosis present

## 2024-11-24 MED ORDER — IBUPROFEN 400 MG PO TABS
600.0000 mg | ORAL_TABLET | Freq: Once | ORAL | Status: AC
  Administered 2024-11-25: 600 mg via ORAL
  Filled 2024-11-24: qty 1
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
## 2024-11-25 NOTE — Discharge Instructions (Addendum)
 As discussed, you have a fracture in your left fifth finger.  Please remain in the static finger splint, remain with fingers buddy taped until seen by orthopedics.  You may remove the splint to shower.  Please follow-up with Dr. Sebastian orthopedics.  Take ibuprofen  or Tylenol every 6 hours as needed for pain.  Ice the injury.  Return to ED with new symptoms.
